# Patient Record
Sex: Male | Born: 1990 | Race: Black or African American | Hispanic: No | Marital: Single | Smoking: Current every day smoker
Health system: Southern US, Community
[De-identification: ages and names within clinical notes are randomized; demographics above are authoritative.]

## PROBLEM LIST (undated history)

## (undated) DIAGNOSIS — Z973 Presence of spectacles and contact lenses: Secondary | ICD-10-CM

## (undated) HISTORY — PX: TONSILLECTOMY: SUR1361

---
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
## 2014-07-15 ENCOUNTER — Emergency Department (INDEPENDENT_AMBULATORY_CARE_PROVIDER_SITE_OTHER)
Admission: EM | Admit: 2014-07-15 | Discharge: 2014-07-15 | Disposition: A | Discharge status: Home / Self Care | Payer: Managed Care, Other (non HMO) | Source: Home / Self Care | Attending: Emergency Medicine | Admitting: Emergency Medicine

## 2014-07-15 ENCOUNTER — Encounter (HOSPITAL_COMMUNITY): Payer: Self-pay | Admitting: Emergency Medicine

## 2014-07-15 DIAGNOSIS — H6092 Unspecified otitis externa, left ear: Secondary | ICD-10-CM

## 2014-07-15 DIAGNOSIS — H6122 Impacted cerumen, left ear: Secondary | ICD-10-CM

## 2014-07-15 MED ORDER — NEOMYCIN-POLYMYXIN-HC 3.5-10000-1 OT SUSP: 4.0000 [drp] | Freq: Three times a day (TID) | OTIC | Status: DC

## 2014-07-15 NOTE — Discharge Instructions (Signed)
Cerumen Impaction °A cerumen impaction is when the wax in your ear forms a plug. This plug usually causes reduced hearing. Sometimes it also causes an earache or dizziness. Removing a cerumen impaction can be difficult and painful. The wax sticks to the ear canal. The canal is sensitive and bleeds easily. If you try to remove a heavy wax buildup with a cotton tipped swab, you may push it in further. °Irrigation with water, suction, and small ear curettes may be used to clear out the wax. If the impaction is fixed to the skin in the ear canal, ear drops may be needed for a few days to loosen the wax. People who build up a lot of wax frequently can use ear wax removal products available in your local drugstore. °SEEK MEDICAL CARE IF:  °You develop an earache, increased hearing loss, or marked dizziness. °Document Released: 09/04/2004 Document Revised: 10/20/2011 Document Reviewed: 10/25/2009 °ExitCare® Patient Information ©2015 [employer]. This information is not intended to replace advice given to you by your health care provider. Make sure you discuss any questions you have with your health care provider. ° °Otitis Externa °Otitis externa is a bacterial or fungal infection of the outer ear canal. This is the area from the eardrum to the outside of the ear. Otitis externa is sometimes called "swimmer's ear." °CAUSES  °Possible causes of infection include: °· Swimming in dirty water. °· Moisture remaining in the ear after swimming or bathing. °· Mild injury (trauma) to the ear. °· Objects stuck in the ear (foreign body). °· Cuts or scrapes (abrasions) on the outside of the ear. °SIGNS AND SYMPTOMS  °The first symptom of infection is often itching in the ear canal. Later signs and symptoms may include swelling and redness of the ear canal, ear pain, and yellowish-white fluid (pus) coming from the ear. The ear pain may be worse when pulling on the earlobe. °DIAGNOSIS  °Your health care provider will perform a  physical exam. A sample of fluid may be taken from the ear and examined for bacteria or fungi. °TREATMENT  °Antibiotic ear drops are often given for 10 to 14 days. Treatment may also include pain medicine or corticosteroids to reduce itching and swelling. °HOME CARE INSTRUCTIONS  °· Apply antibiotic ear drops to the ear canal as prescribed by your health care provider. °· Take medicines only as directed by your health care provider. °· If you have diabetes, follow any additional treatment instructions from your health care provider. °· Keep all follow-up visits as directed by your health care provider. °PREVENTION  °· Keep your ear dry. Use the corner of a towel to absorb water out of the ear canal after swimming or bathing. °· Avoid scratching or putting objects inside your ear. This can damage the ear canal or remove the protective wax that lines the canal. This makes it easier for bacteria and fungi to grow. °· Avoid swimming in lakes, polluted water, or poorly chlorinated pools. °· You may use ear drops made of rubbing alcohol and vinegar after swimming. Combine equal parts of white vinegar and alcohol in a bottle. Put 3 or 4 drops into each ear after swimming. °SEEK MEDICAL CARE IF:  °· You have a fever. °· Your ear is still red, swollen, painful, or draining pus after 3 days. °· Your redness, swelling, or pain gets worse. °· You have a severe headache. °· You have redness, swelling, pain, or tenderness in the area behind your ear. °MAKE SURE YOU:  °·   Understand these instructions. °· Will watch your condition. °· Will get help right away if you are not doing well or get worse. °Document Released: 07/28/2005 Document Revised: 12/12/2013 Document Reviewed: 08/14/2011 °ExitCare® Patient Information ©2015 [employer]. This information is not intended to replace advice given to you by your health care provider. Make sure you discuss any questions you have with your health care provider. ° °

## 2014-07-15 NOTE — ED Provider Notes (Signed)
  Chief Complaint   Otalgia   History of Present Illness   Joshua Salazar is a 23 year old male who's had a one-month history of progressive left ear pain, ringing, and hearing loss. He wears earplugs on his ears and uses Q-tips to clean his ears. He denies any drainage. He's also had some headache, nasal congestion, rhinorrhea, and cough. He denies any fever, chills, sore throat, adenopathy, or stiff neck.  Review of Systems   Other than as noted above, the patient denies any of the following symptoms: Systemic:  No fevers or chills. Eye:  No redness, pain, discharge, itching, blurred vision, or diplopia. ENT:  No headache, nasal congestion, sneezing, itching, epistaxis, ear pain, decreased hearing, ringing in ears, vertigo, or tinnitus.  No oral lesions, sore throat, or hoarseness. Neck:  No neck pain or adenopathy. Skin:  No rash or itching.  PMFSH   Past medical history, family history, social history, meds, and allergies were reviewed.   Physical Examination     Vital signs:  BP 115/73 mmHg  Pulse 69  Temp(Src) 99.3 F (37.4 C) (Oral)  Resp 12  SpO2 98% General:  Alert and oriented.  In no distress.  Skin warm and dry. Eye:  PERRL, full EOMs, lids and conjunctiva normal.   ENT:  There was a cerumen impaction left ear canal. This was irrigated clear. The canal was somewhat erythematous thereafter, the TM appeared normal, but the malleus handle was somewhat inflamed.  Nasal mucosa not congested and without drainage.  Mucous membranes moist, no oral lesions, normal dentition, pharynx clear.  No cranial or facial pain to palplation. There is no pain or swelling over the mastoid. Neck:  Supple, full ROM.  No adenopathy, tenderness or mass.  Thyroid normal. Lungs:  Breath sounds clear and equal bilaterally.  No wheezes, rales or rhonchi. Heart:  Rhythm regular, without extrasystoles.  No gallops or murmers. Skin:  Clear, warm and dry.  Course in Urgent [facility] Center   The ear  was irrigated with warm water.  Assessment   The primary encounter diagnosis was Cerumen impaction, left. A diagnosis of Otitis externa, left was also pertinent to this visit.  Plan    1.  Meds:  The following meds were prescribed:   Discharge Medication List as of 07/15/2014  1:59 PM    START taking these medications   Details  neomycin-polymyxin-hydrocortisone (CORTISPORIN) 3.5-10000-1 otic suspension Place 4 drops into the left ear 3 (three) times daily., Starting 07/15/2014, Until Discontinued, Normal        2.  Patient Education/Counseling:  The patient was given appropriate handouts, self care instructions, and instructed in symptomatic relief.  Avoid water in the ear for the next week. Avoid use of Q-tips.  3.  Follow up:  The patient was told to follow up here if no better in 3 to 4 days, or sooner if becoming worse in any way, and given some red flag symptoms such as worsening ear pain or difficulty hearing which would prompt immediate return.       Reuben Likesavid C Chamberlain Steinborn, MD 07/15/14 [phone]

## 2014-07-15 NOTE — ED Notes (Signed)
C/o left ear pain onset 1 month; getting worse Denies f/v/n/d, cold sx Alert, no signs of acute distress.

## 2016-07-01 ENCOUNTER — Ambulatory Visit (HOSPITAL_COMMUNITY)
Admission: EM | Admit: 2016-07-01 | Discharge: 2016-07-01 | Disposition: A | Discharge status: Home / Self Care | Payer: Managed Care, Other (non HMO) | Attending: Family Medicine | Admitting: Family Medicine

## 2016-07-01 ENCOUNTER — Encounter (HOSPITAL_COMMUNITY): Payer: Self-pay

## 2016-07-01 DIAGNOSIS — L539 Erythematous condition, unspecified: Secondary | ICD-10-CM | POA: Diagnosis not present

## 2016-07-01 DIAGNOSIS — R21 Rash and other nonspecific skin eruption: Secondary | ICD-10-CM

## 2016-07-01 DIAGNOSIS — W57XXXA Bitten or stung by nonvenomous insect and other nonvenomous arthropods, initial encounter: Secondary | ICD-10-CM

## 2016-07-01 MED ORDER — PREDNISONE 20 MG PO TABS: ORAL_TABLET | ORAL | 0 refills | Status: DC

## 2016-07-01 NOTE — ED Triage Notes (Signed)
Patient presents with insect bite on back of right hand which he states is causing pain and swelling, itchy rash is radiating towards neck and back of ears, patient states he has been experiencing these symptoms since last night 06/30/2016. No acute distress.

## 2016-07-01 NOTE — ED Provider Notes (Signed)
[facility]    CSN: [id number] Arrival date & time: 07/01/16  1650     History   Chief Complaint Chief Complaint  Patient presents with  . Insect Bite    right hand     HPI Joshua Salazar is a 25 y.o. male.   This is a 25 year old gentleman who was staying in an apartment last night and developed large whelps on his right hand and right neck. He thinks these may be bedbugs.      History reviewed. No pertinent past medical history.  There are no active problems to display for this patient.   History reviewed. No pertinent surgical history.     Home Medications    Prior to Admission medications   Medication Sig Start Date End Date Taking? Authorizing Provider  neomycin-polymyxin-hydrocortisone (CORTISPORIN) 3.5-10000-1 otic suspension Place 4 drops into the left ear 3 (three) times daily. 07/15/14   Reuben Likesavid C Keller, MD  predniSONE (DELTASONE) 20 MG tablet Two daily with food 07/01/16   Elvina SidleKurt Geoffery Aultman, MD    Family History History reviewed. No pertinent family history.  Social History Social History  Substance Use Topics  . Smoking status: Never Smoker  . Smokeless tobacco: Never Used  . Alcohol use No     Allergies   Patient has no known allergies.   Review of Systems Review of Systems  Constitutional: Negative.   Skin: Positive for rash.     Physical Exam Triage Vital Signs ED Triage Vitals [07/01/16 1708]  Enc Vitals Group     BP 120/75     Pulse Rate 63     Resp 16     Temp 98.6 F (37 C)     Temp Source Oral     SpO2 100 %     Weight      Height      Head Circumference      Peak Flow      Pain Score      Pain Loc      Pain Edu?      Excl. in GC?    No data found.   Updated Vital Signs BP 120/75 (BP Location: Left Arm)   Pulse 63   Temp 98.6 F (37 C) (Oral)   Resp 16   SpO2 100%    Physical Exam  Constitutional: He is oriented to person, place, and time. He appears well-developed and well-nourished.    HENT:  Right Ear: External ear normal.  Left Ear: External ear normal.  Mouth/Throat: Oropharynx is clear and moist.  Eyes: Conjunctivae and EOM are normal.  Neck: Normal range of motion. Neck supple.  Pulmonary/Chest: Effort normal.  Musculoskeletal: Normal range of motion.  Neurological: He is alert and oriented to person, place, and time.  Skin: Skin is warm. There is erythema.  Large 2 cm whelps on right hand and right neck.  Nursing note and vitals reviewed.    UC Treatments / Results  Labs (all labs ordered are listed, but only abnormal results are displayed) Labs Reviewed - No data to display  EKG  EKG Interpretation None       Radiology No results found.  Procedures Procedures (including critical care time)  Medications Ordered in UC Medications - No data to display   Initial Impression / Assessment and Plan / UC Course  I have reviewed the triage vital signs and the nursing notes.  Pertinent labs & imaging results that were available during my care of the patient were  reviewed by me and considered in my medical decision making (see chart for details).  Clinical Course     Final Clinical Impressions(s) / UC Diagnoses   Final diagnoses:  Bedbug bite, initial encounter    New Prescriptions New Prescriptions   PREDNISONE (DELTASONE) 20 MG TABLET    Two daily with food     Elvina SidleKurt Clarisa Danser, MD 07/01/16 1717

## 2016-08-08 ENCOUNTER — Ambulatory Visit (HOSPITAL_COMMUNITY)
Admission: EM | Admit: 2016-08-08 | Discharge: 2016-08-08 | Disposition: A | Discharge status: Other Acute Inpatient Hospital | Payer: Managed Care, Other (non HMO)

## 2016-11-03 ENCOUNTER — Encounter (HOSPITAL_COMMUNITY): Payer: Self-pay | Admitting: Emergency Medicine

## 2016-11-03 ENCOUNTER — Ambulatory Visit (HOSPITAL_COMMUNITY)
Admission: EM | Admit: 2016-11-03 | Discharge: 2016-11-03 | Disposition: A | Discharge status: Home / Self Care | Payer: Managed Care, Other (non HMO) | Attending: Internal Medicine | Admitting: Internal Medicine

## 2016-11-03 DIAGNOSIS — R1011 Right upper quadrant pain: Secondary | ICD-10-CM

## 2016-11-03 MED ORDER — ONDANSETRON 4 MG PO TBDP: 4.0000 mg | ORAL_TABLET | Freq: Three times a day (TID) | ORAL | 0 refills | Status: DC | PRN

## 2016-11-03 MED ORDER — OMEPRAZOLE 40 MG PO CPDR: 40.0000 mg | DELAYED_RELEASE_CAPSULE | Freq: Every day | ORAL | 0 refills | Status: DC

## 2016-11-03 MED ORDER — DICYCLOMINE HCL 20 MG PO TABS: 20.0000 mg | ORAL_TABLET | Freq: Two times a day (BID) | ORAL | 0 refills | Status: DC

## 2016-11-03 NOTE — ED Triage Notes (Addendum)
Pt has been suffering from abdominal pain intermittently for over a year.  Pt was to have a colonoscopy but never went through with it.  Pt says it occurs mostly when he drinks alcohol.

## 2016-11-03 NOTE — Discharge Instructions (Signed)
The signs and symptoms you have described are consistent with biliary colic. We do not have the ability to test for that here in this clinic. I recommend you avoid fatty foods, and I recommend you avoid alcohol. I do recommend you try to establish with a primary care provider who will be able to order the necessary tests and exams to diagnose and treat your condition. In the meantime, I prescribed medicines to help your symptoms. For Nausea, I have prescribed Zofran, take 1 tablet under the tongue every 8 hours as needed. For reflux, I have prescribed Omprazole, take 1 tablet daily. Finally, I have prescribed Bentyl, take 1 tablet twice a day.

## 2016-11-03 NOTE — ED Provider Notes (Signed)
CSN: [id number]     Arrival date & time 11/03/16  1704 History   First MD Initiated Contact with Patient 11/03/16 1813     Chief Complaint  Patient presents with  . Abdominal Pain   (Consider location/radiation/quality/duration/timing/severity/associated sxs/prior Treatment) 26 year old male presents for evaluation for chronic abdominal pain. Said the symptoms for approximately one and half years, states he had previously been advised to follow-up with GI, however he has not yet done so. Current symptoms have been worsening for 1 week   The history is provided by the patient.  Abdominal Pain  Pain location:  RUQ Pain quality: aching, bloating and fullness   Pain radiates to:  Does not radiate Pain severity:  Moderate Onset quality:  Gradual Timing:  Intermittent Progression:  Waxing and waning Chronicity:  Chronic Context: alcohol use and diet changes (worse with fatty foods)   Relieved by:  None tried Worsened by:  Eating Ineffective treatments:  None tried Associated symptoms: belching and nausea   Associated symptoms: no chest pain, no constipation, no cough, no diarrhea, no dysuria, no fatigue, no fever, no flatus, no shortness of breath and no vomiting     History reviewed. No pertinent past medical history. Past Surgical History:  Procedure Laterality Date  . TONSILLECTOMY     History reviewed. No pertinent family history. Social History  Substance Use Topics  . Smoking status: Former Games developermoker  . Smokeless tobacco: Never Used  . Alcohol use Yes     Comment: weekends    Review of Systems  Constitutional: Negative for fatigue and fever.  Respiratory: Negative for cough and shortness of breath.   Cardiovascular: Negative for chest pain.  Gastrointestinal: Positive for abdominal pain and nausea. Negative for constipation, diarrhea, flatus and vomiting.  Genitourinary: Negative for dysuria.  All other systems reviewed and are negative.   Allergies  Patient has no  known allergies.  Home Medications   Prior to Admission medications   Medication Sig Start Date End Date Taking? Authorizing Provider  dicyclomine (BENTYL) 20 MG tablet Take 1 tablet (20 mg total) by mouth 2 (two) times daily. 11/03/16   Dorena BodoLawrence Raekwan Spelman, NP  omeprazole (PRILOSEC) 40 MG capsule Take 1 capsule (40 mg total) by mouth daily. 11/03/16   Dorena BodoLawrence Kambrey Hagger, NP  ondansetron (ZOFRAN ODT) 4 MG disintegrating tablet Take 1 tablet (4 mg total) by mouth every 8 (eight) hours as needed for nausea or vomiting. 11/03/16   Dorena BodoLawrence Geneveive Furness, NP   Meds Ordered and Administered this Visit  Medications - No data to display  BP 113/69 (BP Location: Right Arm)   Pulse 65   Temp 99 F (37.2 C) (Oral)   SpO2 98%  No data found.   Physical Exam  Constitutional: He is oriented to person, place, and time. He appears well-developed and well-nourished. No distress.  HENT:  Head: Normocephalic and atraumatic.  Right Ear: External ear normal.  Left Ear: External ear normal.  Cardiovascular: Normal rate and regular rhythm.   Pulmonary/Chest: Effort normal and breath sounds normal.  Abdominal: Soft. Normal appearance and bowel sounds are normal. There is no hepatosplenomegaly. There is tenderness in the right upper quadrant. There is no rigidity, no rebound, no guarding and no CVA tenderness.  Neurological: He is alert and oriented to person, place, and time.  Skin: Skin is warm and dry. Capillary refill takes less than 2 seconds. He is not diaphoretic.  Psychiatric: He has a normal mood and affect. His behavior is normal.  Nursing note and  vitals reviewed.   Urgent Care Course     Procedures (including critical care time)  Labs Review Labs Reviewed - No data to display  Imaging Review No results found.    MDM   1. Right upper quadrant abdominal pain    Explained to patient that his signs and symptoms are consistent with a diseased gallbladder. Recommend he follow-up with his primary  care provider, or establish with a primary care provider for further evaluation and management of this condition. Advised avoiding alcohol, fatty foods, prescribed Zofran, Prilosec, Bentyl. Explained to patient that his symptoms most likely will not resolve without further evaluation.    Dorena Bodo, NP 11/03/16 2133

## 2016-11-21 ENCOUNTER — Other Ambulatory Visit: Payer: Self-pay | Admitting: Gastroenterology

## 2016-11-21 DIAGNOSIS — R1010 Upper abdominal pain, unspecified: Secondary | ICD-10-CM

## 2016-12-01 ENCOUNTER — Other Ambulatory Visit: Payer: Managed Care, Other (non HMO)

## 2016-12-30 ENCOUNTER — Ambulatory Visit (HOSPITAL_COMMUNITY)
Admission: EM | Admit: 2016-12-30 | Discharge: 2016-12-30 | Disposition: A | Discharge status: Home / Self Care | Payer: Managed Care, Other (non HMO) | Attending: Family Medicine | Admitting: Family Medicine

## 2016-12-30 ENCOUNTER — Ambulatory Visit (INDEPENDENT_AMBULATORY_CARE_PROVIDER_SITE_OTHER): Payer: Managed Care, Other (non HMO)

## 2016-12-30 ENCOUNTER — Encounter (HOSPITAL_COMMUNITY): Payer: Self-pay | Admitting: Emergency Medicine

## 2016-12-30 DIAGNOSIS — S60222A Contusion of left hand, initial encounter: Secondary | ICD-10-CM

## 2016-12-30 MED ORDER — DICLOFENAC SODIUM 75 MG PO TBEC: 75.0000 mg | DELAYED_RELEASE_TABLET | Freq: Two times a day (BID) | ORAL | 1 refills | Status: DC

## 2016-12-30 NOTE — ED Provider Notes (Signed)
[facility]    CSN: [id number] Arrival date & time: 12/30/16  1337     History   Chief Complaint Chief Complaint  Patient presents with  . Hand Pain    HPI Joshua Salazar is a 26 y.o. male.   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   he struck a heavy glass door about 3 months ago with his left hand (he is right-hand dominant) and he's had pain in his fifth metacarpal ever since.      History reviewed. No pertinent past medical history.  There are no active problems to display for this patient.   Past Surgical History:  Procedure Laterality Date  . TONSILLECTOMY         Home Medications    Prior to Admission medications   Medication Sig Start Date End Date Taking? Authorizing Provider  diclofenac (VOLTAREN) 75 MG EC tablet Take 1 tablet (75 mg total) by mouth 2 (two) times daily. 12/30/16   Elvina SidleLauenstein, Sebastain Fishbaugh, MD    Family History History reviewed. No pertinent family history.  Social History Social History  Substance Use Topics  . Smoking status: Former Games developermoker  . Smokeless tobacco: Never Used  . Alcohol use Yes     Comment: weekends     Allergies   Patient has no known allergies.   Review of Systems Review of Systems  Musculoskeletal: Positive for joint swelling.  All other systems reviewed and are negative.    Physical Exam Triage Vital Signs ED Triage Vitals  Enc Vitals Group     BP 12/30/16 1400 121/72     Pulse Rate 12/30/16 1400 (!) 58     Resp 12/30/16 1400 18     Temp 12/30/16 1400 98.5 F (36.9 C)     Temp Source 12/30/16 1400 Oral     SpO2 12/30/16 1400 98 %     Weight --      Height --      Head Circumference --      Peak Flow --      Pain Score 12/30/16 1359 7     Pain Loc --      Pain Edu? --      Excl. in GC? --    No data found.   Updated Vital Signs BP 121/72 (BP Location: Right Arm)   Pulse (!) 58   Temp 98.5 F (36.9 C) (Oral)    Resp 18   SpO2 98%    Physical Exam  Constitutional: He is oriented to person, place, and time. He appears well-developed and well-nourished.  HENT:  Right Ear: External ear normal.  Left Ear: External ear normal.  Mouth/Throat: Oropharynx is clear and moist.  Eyes: Conjunctivae and EOM are normal. Pupils are equal, round, and reactive to light.  Neck: Normal range of motion. Neck supple.  Pulmonary/Chest: Effort normal.  Musculoskeletal: Normal range of motion. He exhibits tenderness. He exhibits no deformity.  Neurological: He is alert and oriented to person, place, and time.  Skin: Skin is warm and dry.  Nursing note and vitals reviewed.    UC Treatments / Results  Labs (all labs ordered are listed, but only abnormal results are displayed) Labs Reviewed - No data to display  EKG  EKG Interpretation None       Radiology Dg Hand Complete Left  Result Date: 12/30/2016 CLINICAL DATA:  Hip hard object with hand 3 months ago with persistent pain.  EXAM: LEFT HAND - COMPLETE 3+ VIEW COMPARISON:  None. FINDINGS: There is no evidence of fracture or dislocation. There is no evidence of arthropathy or other focal bone abnormality. Soft tissues are unremarkable. IMPRESSION: Negative. Electronically Signed   By: Sebastian Ache M.D.   On: 12/30/2016 14:50    Procedures Procedures (including critical care time)  Medications Ordered in UC Medications - No data to display   Initial Impression / Assessment and Plan / UC Course  I have reviewed the triage vital signs and the nursing notes.  Pertinent labs & imaging results that were available during my care of the patient were reviewed by me and considered in my medical decision making (see chart for details).     Final Clinical Impressions(s) / UC Diagnoses   Final diagnoses:  Contusion of left hand, initial encounter    New Prescriptions New Prescriptions   DICLOFENAC (VOLTAREN) 75 MG EC TABLET    Take 1 tablet (75 mg  total) by mouth 2 (two) times daily.     Elvina Sidle, MD 12/30/16 1455

## 2016-12-30 NOTE — Discharge Instructions (Signed)
The x-rays show no fracture. This appears to be a bone bruise and should heal with the anti-inflammatory medicine prescribed.

## 2016-12-30 NOTE — ED Triage Notes (Signed)
[facility] a [facility]. The patient reported that he injured it 2 months ago and feels that the lifting that he does at work has irritated it.

## 2020-04-03 ENCOUNTER — Ambulatory Visit: Payer: Self-pay | Attending: Internal Medicine

## 2020-04-03 DIAGNOSIS — Z23 Encounter for immunization: Secondary | ICD-10-CM

## 2020-04-03 NOTE — Progress Notes (Signed)
   Covid-19 Vaccination Clinic  Name:  [name]    MRN: [id number] DOB: 05/12/1991  04/03/2020  [name] was observed post Covid-19 immunization for 15 minutes without incident. He was provided with Vaccine Information Sheet and instruction to access the V-Safe system.   [name] was instructed to call 911 with any severe reactions post vaccine: Marland Kitchen Difficulty breathing  . Swelling of face and throat  . A fast heartbeat  . A bad rash all over body  . Dizziness and weakness   Immunizations Administered    Name Date Dose VIS Date Route   Pfizer COVID-19 Vaccine 04/03/2020 11:34 AM 0.3 mL 10/05/2018 Intramuscular   Manufacturer: [employer], [employer]   Lot: [id number]   NDC: [address]-4

## 2020-04-24 ENCOUNTER — Other Ambulatory Visit: Payer: Self-pay

## 2020-04-24 ENCOUNTER — Ambulatory Visit: Payer: Self-pay

## 2020-04-24 ENCOUNTER — Other Ambulatory Visit: Payer: Self-pay | Admitting: *Deleted

## 2020-06-29 ENCOUNTER — Ambulatory Visit
Admission: RE | Admit: 2020-06-29 | Discharge: 2020-06-29 | Disposition: A | Discharge status: Home / Self Care | Payer: 59 | Source: Ambulatory Visit | Attending: Family Medicine | Admitting: Family Medicine

## 2020-06-29 ENCOUNTER — Other Ambulatory Visit: Payer: Self-pay | Admitting: Family Medicine

## 2020-06-29 DIAGNOSIS — Z8616 Personal history of COVID-19: Secondary | ICD-10-CM

## 2020-06-29 DIAGNOSIS — R0602 Shortness of breath: Secondary | ICD-10-CM

## 2021-04-12 ENCOUNTER — Encounter (HOSPITAL_COMMUNITY): Payer: Self-pay | Admitting: Emergency Medicine

## 2021-04-12 ENCOUNTER — Ambulatory Visit (HOSPITAL_COMMUNITY)
Admission: EM | Admit: 2021-04-12 | Discharge: 2021-04-12 | Disposition: A | Discharge status: Home / Self Care | Payer: 59 | Attending: Emergency Medicine | Admitting: Emergency Medicine

## 2021-04-12 ENCOUNTER — Other Ambulatory Visit: Payer: Self-pay

## 2021-04-12 ENCOUNTER — Ambulatory Visit (INDEPENDENT_AMBULATORY_CARE_PROVIDER_SITE_OTHER): Payer: 59

## 2021-04-12 DIAGNOSIS — M940 Chondrocostal junction syndrome [Tietze]: Secondary | ICD-10-CM

## 2021-04-12 DIAGNOSIS — R079 Chest pain, unspecified: Secondary | ICD-10-CM | POA: Diagnosis not present

## 2021-04-12 MED ORDER — KETOROLAC TROMETHAMINE 60 MG/2ML IM SOLN
60.0000 mg | Freq: Once | INTRAMUSCULAR | Status: AC
  Administered 2021-04-12: 60 mg via INTRAMUSCULAR

## 2021-04-12 MED ORDER — DICLOFENAC SODIUM 75 MG PO TBEC: 75.0000 mg | DELAYED_RELEASE_TABLET | Freq: Two times a day (BID) | ORAL | 0 refills | Status: AC

## 2021-04-12 MED ORDER — KETOROLAC TROMETHAMINE 60 MG/2ML IM SOLN
INTRAMUSCULAR | Status: AC
  Filled 2021-04-12: qty 2

## 2021-04-12 NOTE — ED Provider Notes (Signed)
[facility]    CSN: [id number] Arrival date & time: 04/12/21  1330      History   Chief Complaint Chief Complaint  Patient presents with   Chest Pain   Rib Injury    HPI Joshua Salazar is a 30 y.o. male.   Patient complains of right-sided rib pain for the past 6 days.  States that he was roughhousing with his cousin, which he does frequently, and fell on his right side, does not know exactly how he fell but that is when the pain started.  States since initial onset pain has become worse.  States he is now having difficulty taking a deep breath is very uncomfortable lying down on that side or on his back.  Pain radiates from the front lower ribs to the back lower ribs.  States it hurts to reach over his head with his arms and also hurts just walking or with any impact to his body.  Patient denies recent illness.  Patient states he has tried ibuprofen for pain which does not help.  Patient has not been seen by any other provider for this issue.   Chest Pain Pain location:  R chest and R lateral chest Pain quality: aching, radiating and sharp   Pain severity:  Moderate Onset quality:  Gradual Duration:  6 days Timing:  Constant Progression:  Worsening Chronicity:  New Context: breathing, movement, raising an arm, at rest and trauma   Relieved by:  Nothing Worsened by:  Certain positions, deep breathing, movement, coughing and exertion Ineffective treatments: Ibuprofen. Associated symptoms: no altered mental status, no cough, no dizziness, no headache, no heartburn, no nausea, no orthopnea, no palpitations, no PND, no shortness of breath and no vomiting    History reviewed. No pertinent past medical history.  There are no problems to display for this patient.   Past Surgical History:  Procedure Laterality Date   TONSILLECTOMY         Home Medications    Prior to Admission medications   Medication Sig Start Date End Date Taking? Authorizing Provider   diclofenac (VOLTAREN) 75 MG EC tablet Take 1 tablet (75 mg total) by mouth 2 (two) times daily. 04/12/21 05/12/21  Theadora Rama, PA-C    Family History History reviewed. No pertinent family history.  Social History Social History   Tobacco Use   Smoking status: Former   Smokeless tobacco: Never  Substance Use Topics   Alcohol use: Yes    Comment: weekends   Drug use: No     Allergies   Patient has no known allergies.   Review of Systems Review of Systems  Respiratory:  Negative for cough and shortness of breath.   Cardiovascular:  Negative for chest pain, palpitations, orthopnea and PND.  Gastrointestinal:  Negative for constipation, diarrhea, heartburn, nausea and vomiting.  Neurological:  Negative for dizziness and headaches.  All other systems reviewed and are negative.   Physical Exam Triage Vital Signs ED Triage Vitals  Enc Vitals Group     BP 04/12/21 1345 127/84     Pulse Rate 04/12/21 1345 62     Resp 04/12/21 1345 16     Temp 04/12/21 1345 99 F (37.2 C)     Temp Source 04/12/21 1345 Oral     SpO2 04/12/21 1345 96 %     Weight 04/12/21 1344 210 lb (95.3 kg)     Height 04/12/21 1344 6\' 1"  (1.854 m)     Head Circumference --  Peak Flow --      Pain Score 04/12/21 1343 8     Pain Loc --      Pain Edu? --      Excl. in GC? --    No data found.  Updated Vital Signs BP 127/84 (BP Location: Right Arm)   Pulse 62   Temp 99 F (37.2 C) (Oral)   Resp 16   Ht 6\' 1"  (1.854 m)   Wt 210 lb (95.3 kg)   SpO2 96%   BMI 27.71 kg/m   Visual Acuity Right Eye Distance:   Left Eye Distance:   Bilateral Distance:    Right Eye Near:   Left Eye Near:    Bilateral Near:     Physical Exam Constitutional:      Appearance: He is well-developed.  HENT:     Head: Normocephalic and atraumatic.  Eyes:     Extraocular Movements: Extraocular movements intact.     Pupils: Pupils are equal, round, and reactive to light.  Cardiovascular:     Rate and  Rhythm: Normal rate and regular rhythm.     Heart sounds: Normal heart sounds.  Pulmonary:     Effort: Pulmonary effort is normal.     Breath sounds: Normal breath sounds.  Chest:     Chest wall: Tenderness (Right anterior and posterior ribs 9 through 12) present.  Abdominal:     General: Bowel sounds are normal.     Palpations: Abdomen is soft.  Musculoskeletal:       Arms:     Cervical back: Normal range of motion and neck supple.     Thoracic back: Tenderness and bony tenderness present. No swelling, edema, deformity, signs of trauma, lacerations or spasms.     Lumbar back: No tenderness or bony tenderness. Normal range of motion.  Skin:    General: Skin is warm and dry.  Neurological:     General: No focal deficit present.     Mental Status: He is alert and oriented to person, place, and time.  Psychiatric:        Mood and Affect: Mood normal.        Behavior: Behavior normal.     UC Treatments / Results  Labs (all labs ordered are listed, but only abnormal results are displayed) Labs Reviewed - No data to display  EKG   Radiology DG Chest 2 View  Result Date: 04/12/2021 CLINICAL DATA:  Right rib pain, chronic chest pain EXAM: CHEST - 2 VIEW COMPARISON:  06/29/2020 FINDINGS: The heart size and mediastinal contours are within normal limits. Both lungs are clear. The visualized skeletal structures are unremarkable. IMPRESSION: No active cardiopulmonary disease. Electronically Signed   By: 07/01/2020.  Shick M.D.   On: 04/12/2021 15:09    Procedures Procedures (including critical care time)  Medications Ordered in UC Medications  ketorolac (TORADOL) injection 60 mg (has no administration in time range)    Initial Impression / Assessment and Plan / UC Course  I have reviewed the triage vital signs and the nursing notes.  Pertinent labs & imaging results that were available during my care of the patient were reviewed by me and considered in my medical decision making (see  chart for details).      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  Note provided to keep patient out of work for the next 3 days.  Patient advised to follow-up with primary care.   Discharge Instructions      Begin diclofenac twice daily, do not skip doses.  In 2 to 3 weeks you can begin to decrease doses as tolerated.     ED Prescriptions     Medication Sig Dispense Auth. Provider   diclofenac (VOLTAREN) 75 MG EC tablet Take 1 tablet (75 mg total) by mouth 2 (two) times daily. 60 tablet Theadora Rama, [place]      PDMP not reviewed this encounter.   Theadora Rama, PA-C 04/12/21 1520

## 2021-04-12 NOTE — Discharge Instructions (Addendum)
Begin diclofenac twice daily, do not skip doses.  In 2 to 3 weeks you can begin to decrease doses as tolerated.
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
## 2022-05-20 IMAGING — DX DG CHEST 2V
3 series · 3 of 3 positions shown · non-contrast
Comparison: 06/29/2020

CLINICAL DATA: Right rib pain, chronic chest pain

EXAM:
CHEST - 2 VIEW

[chest ap]
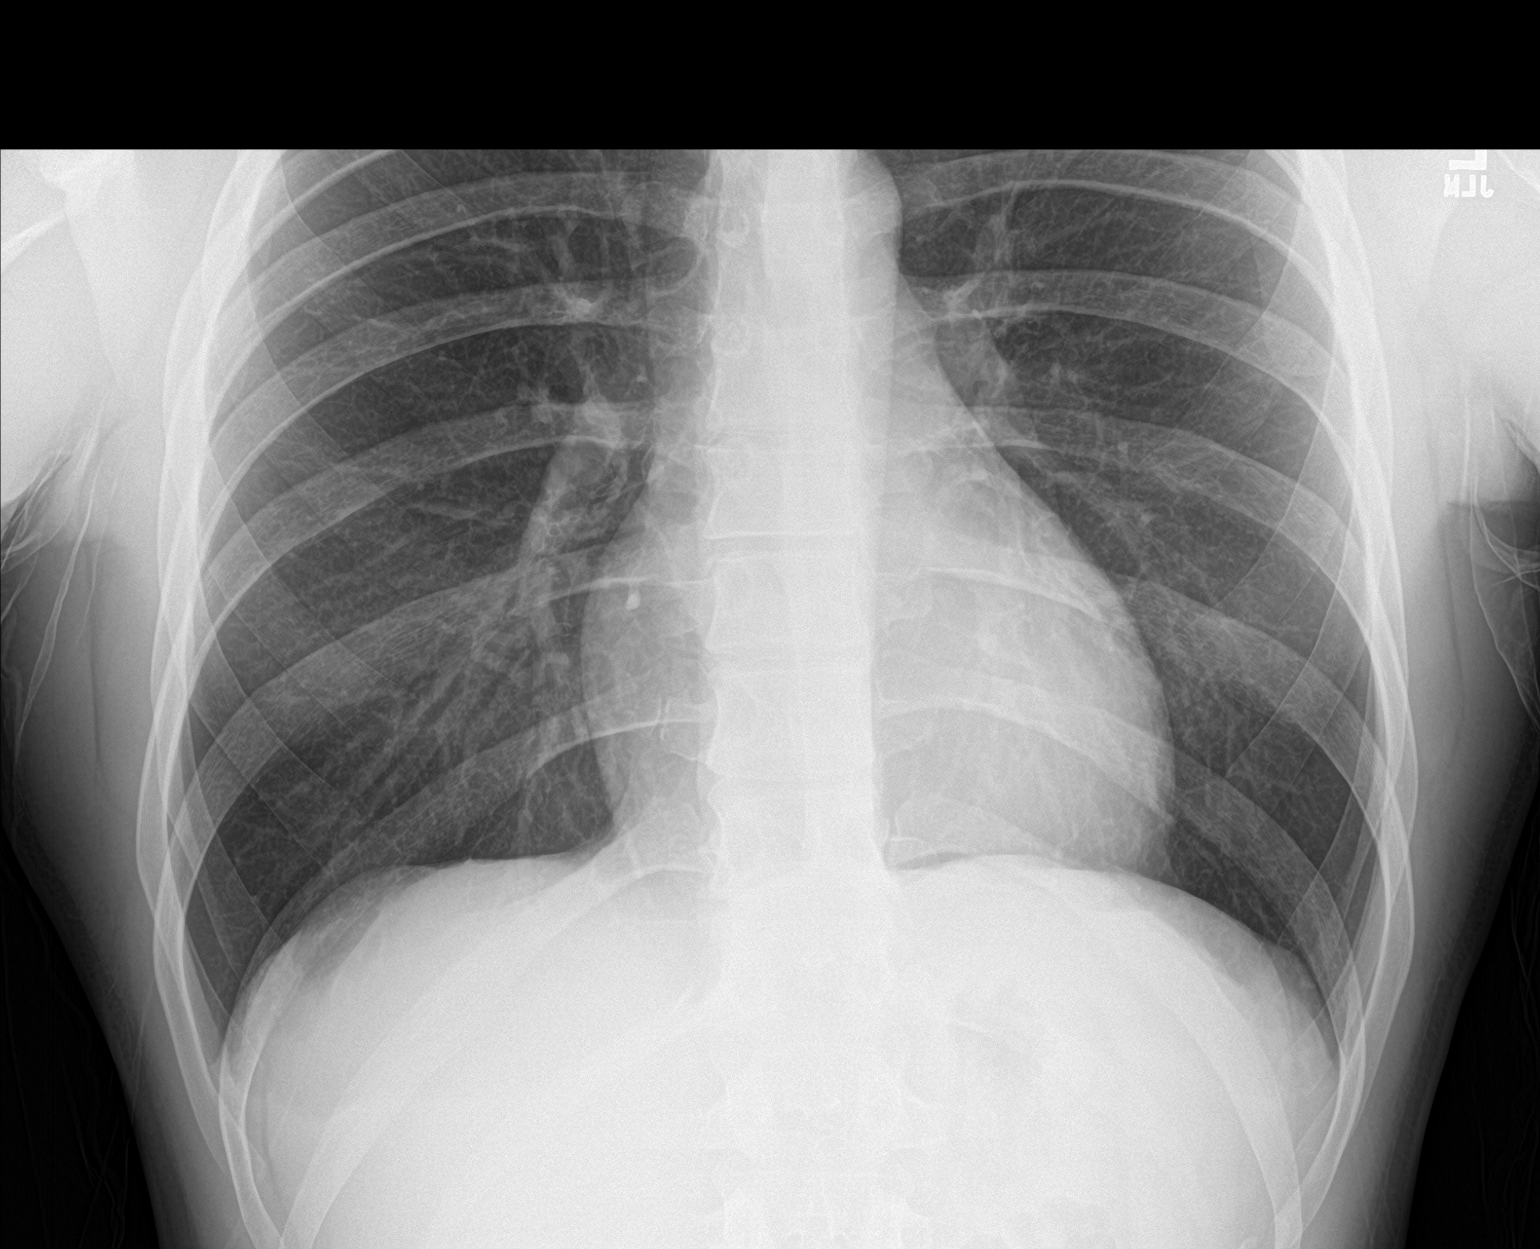

[chest pa]
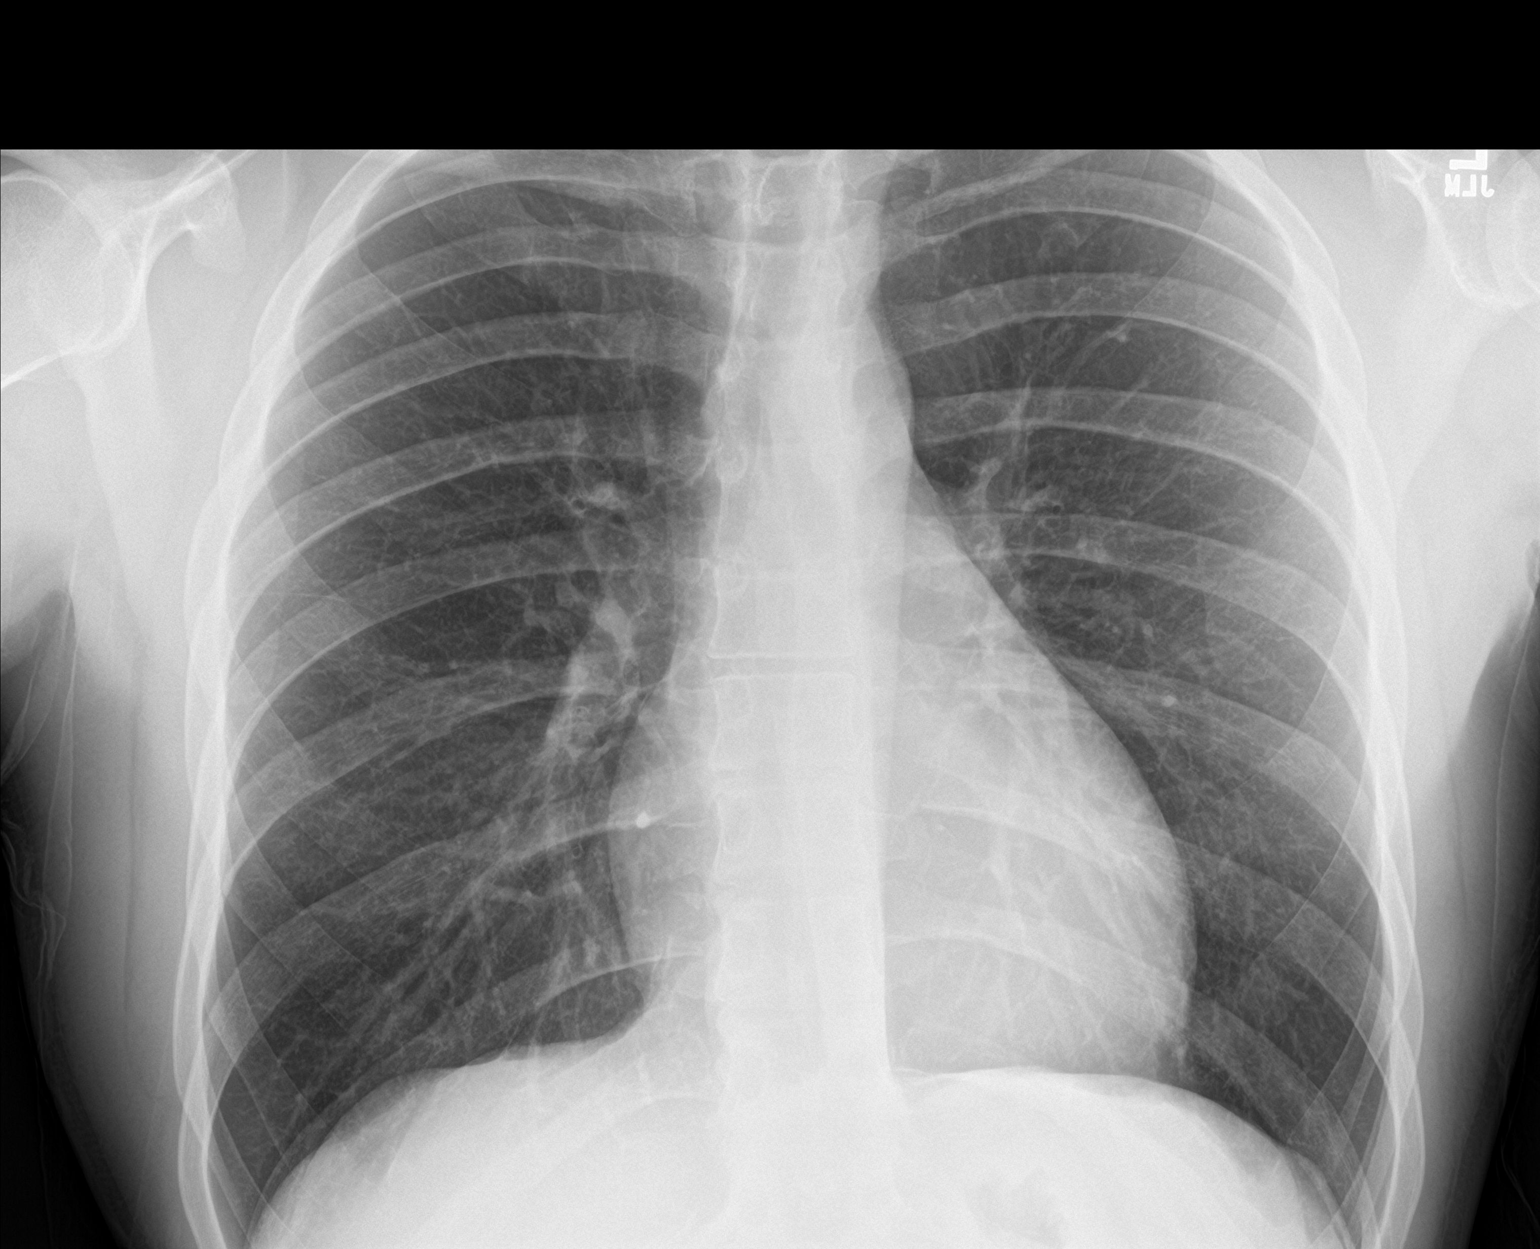

[chest lat]
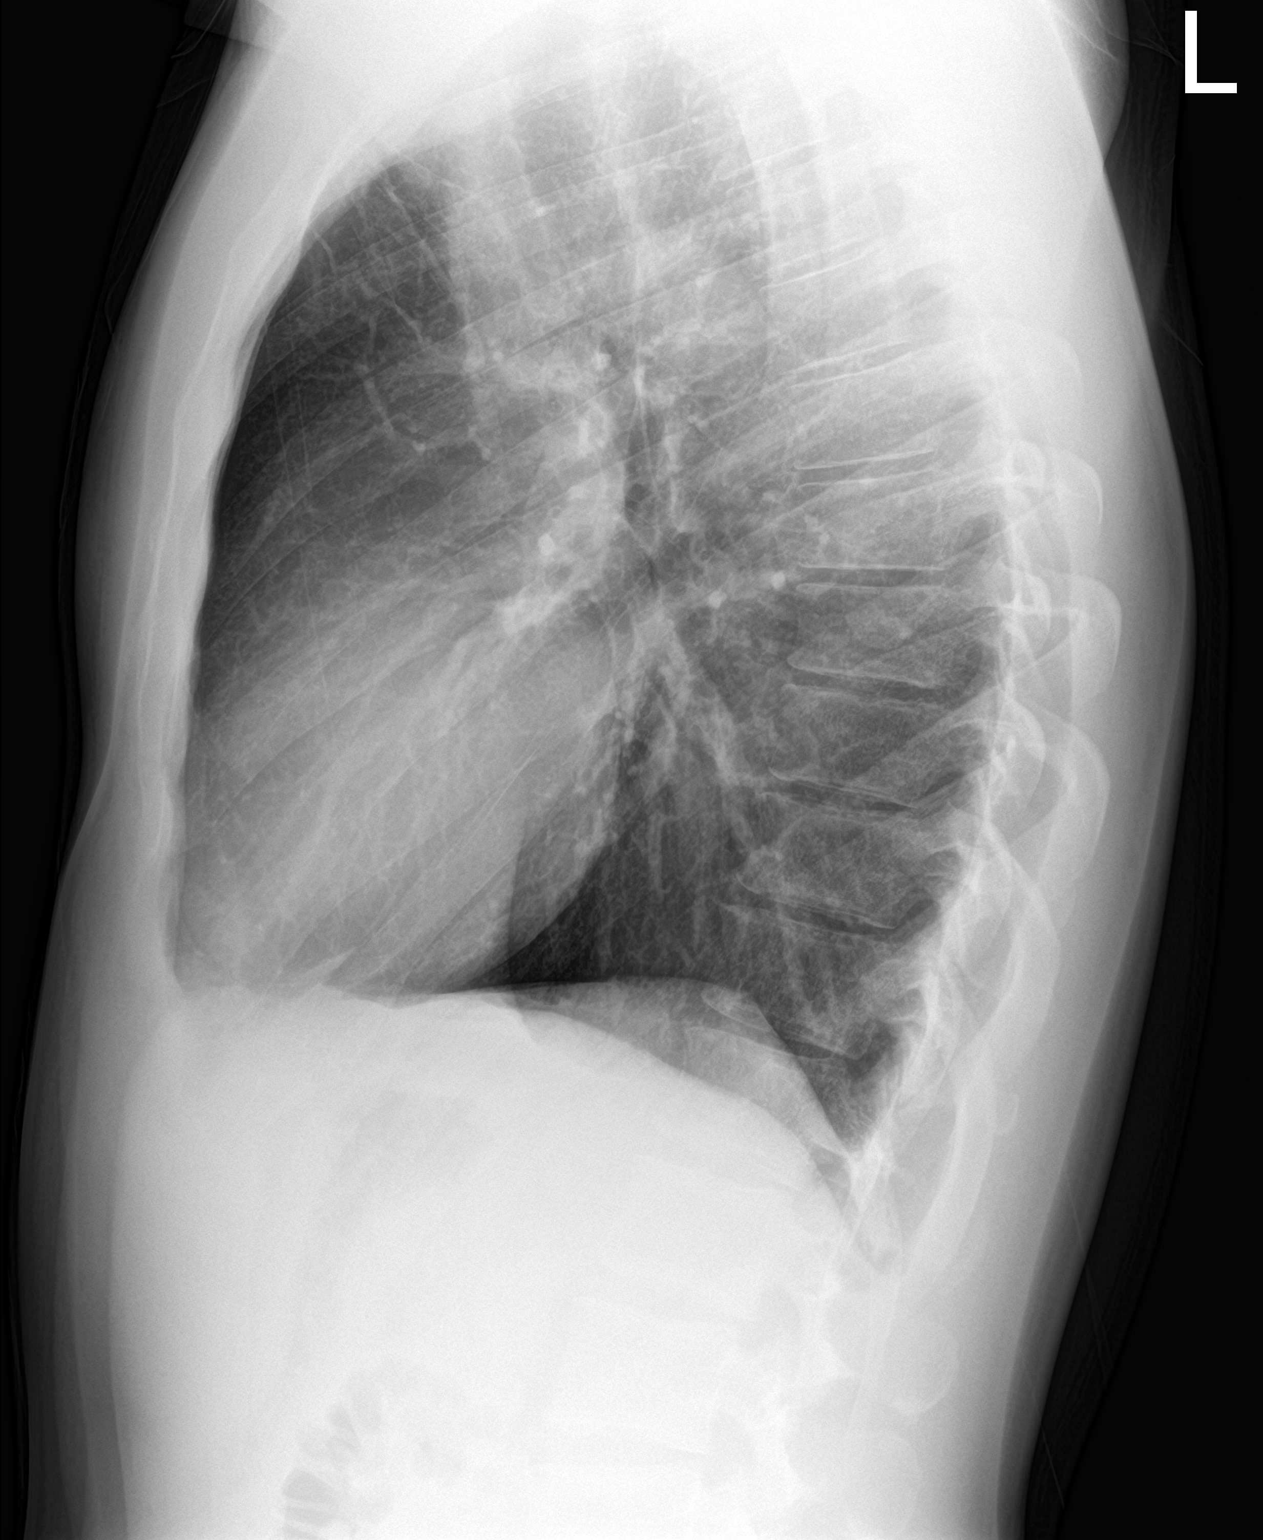

[3 of 3 positions shown; findings below may reference images not displayed]

FINDINGS: The heart size and mediastinal contours are within normal limits.
Both lungs are clear. The visualized skeletal structures are
unremarkable.
IMPRESSION: No active cardiopulmonary disease.

## 2023-01-12 ENCOUNTER — Emergency Department
Admission: EM | Admit: 2023-01-12 | Discharge: 2023-01-12 | Disposition: A | Discharge status: Home / Self Care | Payer: 59 | Attending: Emergency Medicine | Admitting: Emergency Medicine

## 2023-01-12 ENCOUNTER — Emergency Department: Payer: 59

## 2023-01-12 ENCOUNTER — Other Ambulatory Visit: Payer: Self-pay

## 2023-01-12 DIAGNOSIS — S62324A Displaced fracture of shaft of fourth metacarpal bone, right hand, initial encounter for closed fracture: Secondary | ICD-10-CM | POA: Diagnosis not present

## 2023-01-12 DIAGNOSIS — W500XXA Accidental hit or strike by another person, initial encounter: Secondary | ICD-10-CM | POA: Diagnosis not present

## 2023-01-12 DIAGNOSIS — M7989 Other specified soft tissue disorders: Secondary | ICD-10-CM | POA: Diagnosis not present

## 2023-01-12 DIAGNOSIS — M79641 Pain in right hand: Secondary | ICD-10-CM | POA: Diagnosis not present

## 2023-01-12 NOTE — ED Notes (Signed)
Pt verbalizes understanding of discharge instructions. Opportunity for questioning and answers were provided. Pt discharged from ED to home.   ? ?
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
## 2023-01-12 NOTE — Discharge Instructions (Signed)
As we discussed, you will likely need surgery for your hand.  Please call the physician above in order to make this appointment.  Please return for any new, worsening, or change in symptoms or other concerns.  Keep your arm elevated is much as you can.  Keep your splint clean and dry.  It was a pleasure caring for you today.
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
## 2023-01-13 ENCOUNTER — Other Ambulatory Visit: Payer: Self-pay | Admitting: Orthopedic Surgery

## 2023-01-13 DIAGNOSIS — S62324A Displaced fracture of shaft of fourth metacarpal bone, right hand, initial encounter for closed fracture: Secondary | ICD-10-CM | POA: Diagnosis not present

## 2023-01-14 ENCOUNTER — Encounter: Payer: Self-pay | Admitting: Orthopedic Surgery

## 2023-01-16 ENCOUNTER — Ambulatory Visit: Payer: 59 | Admitting: Anesthesiology

## 2023-01-16 ENCOUNTER — Ambulatory Visit: Payer: Self-pay

## 2023-01-16 ENCOUNTER — Ambulatory Visit
Admission: RE | Admit: 2023-01-16 | Discharge: 2023-01-16 | Disposition: A | Discharge status: Home / Self Care | Payer: 59 | Attending: Orthopedic Surgery | Admitting: Orthopedic Surgery

## 2023-01-16 ENCOUNTER — Other Ambulatory Visit: Payer: Self-pay

## 2023-01-16 ENCOUNTER — Encounter: Payer: Self-pay | Admitting: Orthopedic Surgery

## 2023-01-16 ENCOUNTER — Encounter
Admission: RE | Disposition: A | Discharge status: Home / Self Care | Payer: Self-pay | Source: Home / Self Care | Attending: Orthopedic Surgery

## 2023-01-16 DIAGNOSIS — S62324A Displaced fracture of shaft of fourth metacarpal bone, right hand, initial encounter for closed fracture: Secondary | ICD-10-CM | POA: Diagnosis not present

## 2023-01-16 DIAGNOSIS — F17210 Nicotine dependence, cigarettes, uncomplicated: Secondary | ICD-10-CM | POA: Insufficient documentation

## 2023-01-16 DIAGNOSIS — G8918 Other acute postprocedural pain: Secondary | ICD-10-CM | POA: Diagnosis not present

## 2023-01-16 HISTORY — PX: OPEN REDUCTION INTERNAL FIXATION (ORIF) METACARPAL: SHX6234

## 2023-01-16 HISTORY — DX: Presence of spectacles and contact lenses: Z97.3

## 2023-01-16 SURGERY — OPEN REDUCTION INTERNAL FIXATION (ORIF) METACARPAL
Anesthesia: General | Site: Finger | Laterality: Right

## 2023-01-16 MED ORDER — ONDANSETRON HCL 4 MG/2ML IJ SOLN
INTRAMUSCULAR | Status: DC | PRN
  Administered 2023-01-16: 4 mg via INTRAVENOUS

## 2023-01-16 MED ORDER — DEXAMETHASONE SODIUM PHOSPHATE 10 MG/ML IJ SOLN
INTRAMUSCULAR | Status: DC | PRN
  Administered 2023-01-16: 10 mg

## 2023-01-16 MED ORDER — HYDROCODONE-ACETAMINOPHEN 5-325 MG PO TABS: 1.0000 | ORAL_TABLET | ORAL | 0 refills | Status: AC | PRN

## 2023-01-16 MED ORDER — LACTATED RINGERS IV SOLN: INTRAVENOUS | Status: DC

## 2023-01-16 MED ORDER — CEFAZOLIN SODIUM-DEXTROSE 2-4 GM/100ML-% IV SOLN
2.0000 g | INTRAVENOUS | Status: AC
  Administered 2023-01-16: 2 g via INTRAVENOUS

## 2023-01-16 MED ORDER — DEXAMETHASONE SODIUM PHOSPHATE 4 MG/ML IJ SOLN
INTRAMUSCULAR | Status: DC | PRN
  Administered 2023-01-16: 8 mg via INTRAVENOUS

## 2023-01-16 MED ORDER — BUPIVACAINE HCL 0.5 % IJ SOLN
INTRAMUSCULAR | Status: DC | PRN
  Administered 2023-01-16: 30 mL

## 2023-01-16 MED ORDER — ACETAMINOPHEN 10 MG/ML IV SOLN
INTRAVENOUS | Status: DC | PRN
  Administered 2023-01-16: 1000 mg via INTRAVENOUS

## 2023-01-16 MED ORDER — LIDOCAINE HCL (CARDIAC) PF 100 MG/5ML IV SOSY
PREFILLED_SYRINGE | INTRAVENOUS | Status: DC | PRN
  Administered 2023-01-16: 50 mg via INTRATRACHEAL

## 2023-01-16 MED ORDER — LIDOCAINE HCL (PF) 1 % IJ SOLN
INTRAMUSCULAR | Status: DC | PRN
  Administered 2023-01-16: 2 mL

## 2023-01-16 MED ORDER — PROPOFOL 10 MG/ML IV BOLUS
INTRAVENOUS | Status: DC | PRN
  Administered 2023-01-16: 200 mg via INTRAVENOUS

## 2023-01-16 MED ORDER — 0.9 % SODIUM CHLORIDE (POUR BTL) OPTIME
TOPICAL | Status: DC | PRN
  Administered 2023-01-16: 500 mL

## 2023-01-16 MED ORDER — FENTANYL CITRATE (PF) 100 MCG/2ML IJ SOLN
INTRAMUSCULAR | Status: DC | PRN
  Administered 2023-01-16: 100 ug via INTRAVENOUS

## 2023-01-16 MED ORDER — MIDAZOLAM HCL 2 MG/2ML IJ SOLN
2.0000 mg | Freq: Once | INTRAMUSCULAR | Status: AC
  Administered 2023-01-16: 2 mg via INTRAVENOUS

## 2023-01-16 MED ORDER — MIDAZOLAM HCL 5 MG/5ML IJ SOLN
INTRAMUSCULAR | Status: DC | PRN
  Administered 2023-01-16: 2 mg via INTRAVENOUS

## 2023-01-16 MED ORDER — FENTANYL CITRATE (PF) 100 MCG/2ML IJ SOLN
100.0000 ug | Freq: Once | INTRAMUSCULAR | Status: AC
  Administered 2023-01-16: 100 ug via INTRAVENOUS

## 2023-01-16 MED ORDER — DEXMEDETOMIDINE HCL IN NACL 200 MCG/50ML IV SOLN
INTRAVENOUS | Status: DC | PRN
  Administered 2023-01-16 (×2): 10 ug via INTRAVENOUS

## 2023-01-16 SURGICAL SUPPLY — 28 items
APL PRP STRL LF DISP 70% ISPRP (MISCELLANEOUS) ×1
BIT DRILL 2 MINI QC DISP (BIT) IMPLANT
BNDG CMPR STD VLCR NS LF 5.8X4 (GAUZE/BANDAGES/DRESSINGS) ×1
BNDG ELASTIC 4X5.8 VLCR NS LF (GAUZE/BANDAGES/DRESSINGS) ×4 IMPLANT
CHLORAPREP W/TINT 26 (MISCELLANEOUS) ×2 IMPLANT
CUFF TOURN SGL QUICK 18X4 (TOURNIQUET CUFF) IMPLANT
DRAPE FLUOR MINI C-ARM 54X84 (DRAPES) ×2 IMPLANT
ELECT REM PT RETURN 9FT ADLT (ELECTROSURGICAL) ×1
ELECTRODE REM PT RTRN 9FT ADLT (ELECTROSURGICAL) ×2 IMPLANT
GAUZE SPONGE 4X4 12PLY STRL (GAUZE/BANDAGES/DRESSINGS) ×2 IMPLANT
GAUZE XEROFORM 1X8 LF (GAUZE/BANDAGES/DRESSINGS) ×2 IMPLANT
GLOVE SURG SYN 9.0 PF PI (GLOVE) ×2 IMPLANT
GOWN STRL REIN 2XL XLG LVL4 (GOWN DISPOSABLE) ×2 IMPLANT
GOWN STRL REUS W/ TWL LRG LVL3 (GOWN DISPOSABLE) ×2 IMPLANT
GOWN STRL REUS W/TWL LRG LVL3 (GOWN DISPOSABLE) ×1
KIT TURNOVER KIT A (KITS) ×2 IMPLANT
NDL FILTER BLUNT 18X1 1/2 (NEEDLE) ×2 IMPLANT
NEEDLE FILTER BLUNT 18X1 1/2 (NEEDLE) ×1 IMPLANT
NS IRRIG 500ML POUR BTL (IV SOLUTION) ×2 IMPLANT
PACK EXTREMITY ARMC (MISCELLANEOUS) ×2 IMPLANT
PAD PREP 24X41 OB/GYN DISP (PERSONAL CARE ITEMS) ×2 IMPLANT
PADDING CAST BLEND 3X4 STRL (MISCELLANEOUS) ×2 IMPLANT
PLATE LOCKING 2.5 STRAIGHT (Plate) IMPLANT
SCREW PEG 2.5X14 NONLOCK (Screw) IMPLANT
SCREW PEG LOCK 2.5X10 (Peg) IMPLANT
SCREW PEG LOCK 2.5X12 (Screw) IMPLANT
SCREW PEG LOCK 2.5X13 (Screw) IMPLANT
SPLINT CAST 1 STEP 3X12 (MISCELLANEOUS) ×2 IMPLANT

## 2023-01-16 NOTE — Discharge Instructions (Signed)
Keep arm elevated is much as you can Work on finger motion all you can trying to make a fist and fully straighten your fingers Ice to the back of the hand today and tomorrow may help with pain and swelling Keep splint clean and dry Pain medicine and nausea medicine as directed Call office if you are having problems  [phone] 
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
## 2023-01-16 NOTE — Anesthesia Postprocedure Evaluation (Signed)
Anesthesia Post Note  Patient: Joshua Salazar  Procedure(s) Performed: Open reduction & internal fixation of a displaced right fourth metacarpal shaft fracture (Right: Finger)  Anesthesia Type: General Anesthetic complications: no   No notable events documented.   Last Vitals:  Vitals:   01/16/23 1352 01/16/23 1353  BP: 108/75 108/75  Pulse: 78 69  Resp: 17 15  Temp: (!) 36.1 C   SpO2: 96% 98%    Last Pain:  Vitals:   01/16/23 1353  TempSrc:   PainSc: 0-No pain                 Jihan Rudy C Hanley Woerner

## 2023-01-16 NOTE — Anesthesia Procedure Notes (Signed)
Procedure Name: LMA Insertion Date/Time: 01/16/2023 12:35 PM  Performed by: Domenic Moras, CRNAPre-anesthesia Checklist: Patient identified, Emergency Drugs available, Suction available and Patient being monitored Patient Re-evaluated:Patient Re-evaluated prior to induction Oxygen Delivery Method: Circle system utilized Preoxygenation: Pre-oxygenation with 100% oxygen Induction Type: IV induction Ventilation: Mask ventilation without difficulty LMA: LMA inserted LMA Size: 4.0 Tube type: Oral Number of attempts: 1 Placement Confirmation: positive ETCO2 and breath sounds checked- equal and bilateral Tube secured with: Tape Dental Injury: Teeth and Oropharynx as per pre-operative assessment

## 2023-01-16 NOTE — Progress Notes (Signed)
Assisted [place] ANMD with right, interscalene , supraclavicular block. Side rails up, monitors on throughout procedure. See vital signs in flow sheet. Tolerated Procedure well.

## 2023-01-16 NOTE — Anesthesia Preprocedure Evaluation (Addendum)
Anesthesia Evaluation  Patient identified by MRN, date of birth, ID band Patient awake    Reviewed: Allergy & Precautions, H&P , NPO status , Patient's Chart, lab work & pertinent test results  Airway Mallampati: III  TM Distance: >3 FB Neck ROM: Full   Comment: Full beard  Dental no notable dental hx.    Pulmonary Current Smoker   Pulmonary exam normal breath sounds clear to auscultation       Cardiovascular negative cardio ROS Normal cardiovascular exam Rhythm:Regular Rate:Normal     Neuro/Psych negative neurological ROS  negative psych ROS   GI/Hepatic negative GI ROS, Neg liver ROS,,,  Endo/Other  negative endocrine ROS    Renal/GU negative Renal ROS  negative genitourinary   Musculoskeletal negative musculoskeletal ROS (+)    Abdominal   Peds negative pediatric ROS (+)  Hematology negative hematology ROS (+)   Anesthesia Other Findings Wears contact lens  Reproductive/Obstetrics negative OB ROS                             Anesthesia Physical Anesthesia Plan  ASA: 1  Anesthesia Plan: General   Post-op Pain Management: Regional block   Induction: Intravenous  PONV Risk Score and Plan:   Airway Management Planned: LMA  Additional Equipment:   Intra-op Plan:   Post-operative Plan: Extubation in OR  Informed Consent: I have reviewed the patients History and Physical, chart, labs and discussed the procedure including the risks, benefits and alternatives for the proposed anesthesia with the patient or authorized representative who has indicated his/her understanding and acceptance.     Dental Advisory Given  Plan Discussed with: Anesthesiologist, CRNA and Surgeon  Anesthesia Plan Comments: (Patient consented for risks of anesthesia including but not limited to:  - adverse reactions to medications - damage to eyes, teeth, lips or other oral mucosa - nerve damage due  to positioning  - sore throat or hoarseness - Damage to heart, brain, nerves, lungs, other parts of body or loss of life  Patient voiced understanding.)        Anesthesia Quick Evaluation

## 2023-01-16 NOTE — Op Note (Signed)
01/16/2023  1:29 PM  PATIENT:  Joshua Salazar  32 y.o. male  PRE-OPERATIVE DIAGNOSIS:  Closed displaced fracture of shaft of fourth metacarpal bone of right hand, initial encounter S62.324A  POST-OPERATIVE DIAGNOSIS:  Closed displaced fracture of shaft of fourth metacarpal bone of right hand, initial encounter S62.324A  PROCEDURE:  Procedure(s): Open reduction & internal fixation of a displaced right fourth metacarpal shaft fracture (Right)  SURGEON: Leitha Schuller, MD  ASSISTANTS: None  ANESTHESIA:   general  EBL:  Total I/O In: 500 [I.V.:500] Out: -   BLOOD ADMINISTERED:none  DRAINS: none   LOCAL MEDICATIONS USED:  OTHER block given by anesthesia preop  SPECIMEN:  No Specimen  DISPOSITION OF SPECIMEN:  N/A  COUNTS:  YES  TOURNIQUET:   Total Tourniquet Time Documented: Upper Arm (Right) - 36 minutes Total: Upper Arm (Right) - 36 minutes   IMPLANTS: Biomet Zimmer hand fracture system with a 6-hole plate 2.5 mm locking screws and nonlocking screws  DICTATION: .Dragon Dictation patient was brought to the operating room and after adequate general anesthesia was obtained the right arm was prepped and draped in the usual sterile fashion with a tourniquet applied to the upper arm.  After patient identification and timeout procedures were completed tourniquet was raised.  Incision was made between the fourth and fifth metacarpals dorsally subcutaneous tissue spread with extensor tendons and nerves were identified and protected.  The juncturae tendon the between the third and fourth extensor tendons was incised and subsequently repaired to allow for exposure.  The fracture was dorsally displaced distally and with traction after elevating periosteum anatomic reduction was obtained.  A 6-hole plate was then applied dorsally and sequential drilling measuring and placing locking screws with the most distal screw nonlocking to bring the plate down to the bone were applied rotational  alignment was anatomic radiographically AP and lateral imaging showed anatomic alignment.  The wound was thoroughly irrigated and the juncturae tendon a repaired using a 3-0 Vicryl followed by 3-0 Vicryl subcutaneously 4-0 nylon skin sutures followed by Xeroform 4 x 4's web roll and a volar splint  PLAN OF CARE: Discharge to home after PACU  PATIENT DISPOSITION:  PACU - hemodynamically stable.

## 2023-01-16 NOTE — Anesthesia Procedure Notes (Signed)
Anesthesia Regional Block: Supraclavicular block   Pre-Anesthetic Checklist: , timeout performed,  Correct Patient, Correct Site, Correct Laterality,  Correct Procedure, Correct Position, site marked,  Risks and benefits discussed,  Surgical consent,  Pre-op evaluation,  At surgeon's request and post-op pain management  Laterality: Upper and Right  Prep: chloraprep       Needles:  Injection technique: Single-shot  Needle Type: Stimiplex     Needle Length: 9cm  Needle Gauge: 22     Additional Needles:   Procedures:,,,, ultrasound used (permanent image in chart),,    Narrative:  Start time: 01/16/2023 12:10 PM End time: 01/16/2023 12:17 PM Injection made incrementally with aspirations every 5 mL.  Performed by: Personally  Anesthesiologist: Marisue Humble, MD  Additional Notes: Patient consented for risk and benefits of nerve block including but not limited to nerve damage, Horner's syndrome, failed block, bleeding and infection.  Patient voiced understanding.  Functioning IV was confirmed and monitors were applied.  Timeout done prior to procedure and prior to any sedation being given to the patient.  Patient confirmed procedure site prior to any sedation given to the patient.  A 50mm 22ga Stimuplex needle was used. Sterile prep,hand hygiene and sterile gloves were used.  Minimal sedation used for procedure.  No paresthesia endorsed by patient during the procedure.  Negative aspiration and negative test dose prior to incremental administration of local anesthetic. The patient tolerated the procedure well with no immediate complications. Also intercostobrachial block

## 2023-01-16 NOTE — Transfer of Care (Signed)
Immediate Anesthesia Transfer of Care Note  Patient: Joshua Salazar  Procedure(s) Performed: Open reduction & internal fixation of a displaced right fourth metacarpal shaft fracture (Right: Finger)  Patient Location: PACU  Anesthesia Type: General  Level of Consciousness: awake, alert  and patient cooperative  Airway and Oxygen Therapy: Patient Spontanous Breathing and Patient connected to supplemental oxygen  Post-op Assessment: Post-op Vital signs reviewed, Patient's Cardiovascular Status Stable, Respiratory Function Stable, Patent Airway and No signs of Nausea or vomiting  Post-op Vital Signs: Reviewed and stable  Complications: No notable events documented.

## 2023-01-19 ENCOUNTER — Other Ambulatory Visit: Payer: Self-pay

## 2023-01-19 ENCOUNTER — Encounter: Payer: Self-pay | Admitting: Orthopedic Surgery

## 2023-02-04 DIAGNOSIS — S62324D Displaced fracture of shaft of fourth metacarpal bone, right hand, subsequent encounter for fracture with routine healing: Secondary | ICD-10-CM | POA: Diagnosis not present

## 2023-03-03 DIAGNOSIS — M25541 Pain in joints of right hand: Secondary | ICD-10-CM | POA: Diagnosis not present

## 2023-06-09 DIAGNOSIS — Z8781 Personal history of (healed) traumatic fracture: Secondary | ICD-10-CM | POA: Diagnosis not present

## 2023-06-09 DIAGNOSIS — T8484XA Pain due to internal orthopedic prosthetic devices, implants and grafts, initial encounter: Secondary | ICD-10-CM | POA: Diagnosis not present

## 2023-06-09 DIAGNOSIS — Z9889 Other specified postprocedural states: Secondary | ICD-10-CM | POA: Diagnosis not present
